# Patient Record
Sex: Female | Born: 1970 | Race: Black or African American | Hispanic: No | Smoking: Never smoker
Health system: Southern US, Community
[De-identification: ages and names within clinical notes are randomized; demographics above are authoritative.]

## PROBLEM LIST (undated history)

## (undated) DIAGNOSIS — J45909 Unspecified asthma, uncomplicated: Secondary | ICD-10-CM

## (undated) HISTORY — PX: APPENDECTOMY: SHX54

## (undated) HISTORY — PX: TONSILLECTOMY: SUR1361

---

## 2010-09-17 ENCOUNTER — Emergency Department (HOSPITAL_COMMUNITY): Admission: EM | Admit: 2010-09-17 | Discharge: 2010-09-17 | Payer: Self-pay | Admitting: Emergency Medicine

## 2010-09-30 ENCOUNTER — Emergency Department (HOSPITAL_COMMUNITY): Admission: EM | Admit: 2010-09-30 | Discharge: 2010-09-30 | Payer: Self-pay | Admitting: Emergency Medicine

## 2010-10-19 ENCOUNTER — Inpatient Hospital Stay (HOSPITAL_COMMUNITY): Admission: AD | Admit: 2010-10-19 | Discharge: 2010-10-19 | Payer: Self-pay | Admitting: Obstetrics & Gynecology

## 2011-02-08 LAB — GC/CHLAMYDIA PROBE AMP, GENITAL: Chlamydia, DNA Probe: NEGATIVE

## 2011-02-08 LAB — URINE CULTURE: Colony Count: 10000

## 2011-02-08 LAB — URINALYSIS, ROUTINE W REFLEX MICROSCOPIC
Bilirubin Urine: NEGATIVE
Bilirubin Urine: NEGATIVE
Hgb urine dipstick: NEGATIVE
Nitrite: NEGATIVE
Nitrite: NEGATIVE
Protein, ur: 30 mg/dL — AB
Protein, ur: NEGATIVE mg/dL
Urobilinogen, UA: 1 mg/dL (ref 0.0–1.0)
Urobilinogen, UA: 1 mg/dL (ref 0.0–1.0)
pH: 6 (ref 5.0–8.0)

## 2011-02-08 LAB — URINE MICROSCOPIC-ADD ON

## 2011-02-08 LAB — POCT PREGNANCY, URINE: Preg Test, Ur: NEGATIVE

## 2011-02-08 LAB — WET PREP, GENITAL: Clue Cells Wet Prep HPF POC: NONE SEEN

## 2011-02-09 LAB — URINALYSIS, ROUTINE W REFLEX MICROSCOPIC
Glucose, UA: NEGATIVE mg/dL
pH: 6 (ref 5.0–8.0)

## 2011-02-09 LAB — URINE MICROSCOPIC-ADD ON

## 2012-06-11 ENCOUNTER — Emergency Department (HOSPITAL_COMMUNITY): Payer: Self-pay

## 2012-06-11 ENCOUNTER — Emergency Department (HOSPITAL_COMMUNITY)
Admission: EM | Admit: 2012-06-11 | Discharge: 2012-06-11 | Disposition: A | Discharge status: Home / Self Care | Payer: Self-pay | Attending: Emergency Medicine | Admitting: Emergency Medicine

## 2012-06-11 ENCOUNTER — Encounter (HOSPITAL_COMMUNITY): Payer: Self-pay | Admitting: Emergency Medicine

## 2012-06-11 DIAGNOSIS — J45909 Unspecified asthma, uncomplicated: Secondary | ICD-10-CM | POA: Insufficient documentation

## 2012-06-11 DIAGNOSIS — X500XXA Overexertion from strenuous movement or load, initial encounter: Secondary | ICD-10-CM | POA: Insufficient documentation

## 2012-06-11 DIAGNOSIS — M79609 Pain in unspecified limb: Secondary | ICD-10-CM | POA: Insufficient documentation

## 2012-06-11 DIAGNOSIS — S93609A Unspecified sprain of unspecified foot, initial encounter: Secondary | ICD-10-CM | POA: Insufficient documentation

## 2012-06-11 DIAGNOSIS — IMO0002 Reserved for concepts with insufficient information to code with codable children: Secondary | ICD-10-CM | POA: Insufficient documentation

## 2012-06-11 HISTORY — DX: Unspecified asthma, uncomplicated: J45.909

## 2012-06-11 MED ORDER — HYDROCODONE-ACETAMINOPHEN 5-325 MG PO TABS
2.0000 | ORAL_TABLET | Freq: Once | ORAL | Status: DC
  Filled 2012-06-11: qty 2

## 2012-06-11 MED ORDER — IBUPROFEN 600 MG PO TABS: 600.0000 mg | ORAL_TABLET | Freq: Three times a day (TID) | ORAL | Status: AC | PRN

## 2012-06-11 MED ORDER — IBUPROFEN 400 MG PO TABS
600.0000 mg | ORAL_TABLET | Freq: Once | ORAL | Status: AC
  Administered 2012-06-11: 600 mg via ORAL
  Filled 2012-06-11: qty 1

## 2012-06-11 MED ORDER — HYDROCODONE-ACETAMINOPHEN 5-500 MG PO TABS: 1.0000 | ORAL_TABLET | Freq: Four times a day (QID) | ORAL | Status: AC | PRN

## 2012-06-11 NOTE — ED Provider Notes (Addendum)
History    This chart was scribed for Suzi Roots, MD, MD by Smitty Pluck. The patient was seen in room TR09C and the patient's care was started at 3:35PM.   CSN: [id number]  Arrival date & time 06/11/12  1520   None     Chief Complaint  Patient presents with  . Foot Pain    (Consider location/radiation/quality/duration/timing/severity/associated sxs/prior treatment) Patient is a 41 y.o. female presenting with lower extremity pain. The history is provided by the patient.  Foot Pain Pertinent negatives include no shortness of breath.   Patty Blankenship is a 41 y.o. female who presents to the Emergency Department complaining of constant moderate right foot pain onset today 1 hour ago. Pt reports rolling her right foot as she stepped onto gravel path outside of son's school. Pt reports that she is not able to walk due to pain. Reports that her last tetanus was 5 years ago. Denies knee pain. Denies radiation. Pt reports abrasion to right foot. Denies any other pain.   Past Medical History  Diagnosis Date  . Asthma     History reviewed. No pertinent past surgical history.  History reviewed. No pertinent family history.  History  Substance Use Topics  . Smoking status: Never Smoker   . Smokeless tobacco: Not on file  . Alcohol Use: No    OB History    Grav Para Term Preterm Abortions TAB SAB Ect Mult Living                  Review of Systems  Constitutional: Negative for fever and chills.  Respiratory: Negative for shortness of breath.   Gastrointestinal: Negative for nausea and vomiting.  Neurological: Negative for weakness.    Allergies  Crab and Sulfa antibiotics  Home Medications   Current Outpatient Rx  Name Route Sig Dispense Refill  . ACETAMINOPHEN 500 MG PO TABS Oral Take 1,000 mg by mouth every 6 (six) hours as needed. For pain    . ALBUTEROL SULFATE HFA 108 (90 BASE) MCG/ACT IN AERS Inhalation Inhale 2 puffs into the lungs every 6 (six) hours as needed.  For shortness of breath      BP 132/80  Pulse 111  Temp 98.2 F (36.8 C) (Oral)  Resp 16  SpO2 98%  Physical Exam  Nursing note and vitals reviewed. Constitutional: She is oriented to person, place, and time. She appears well-developed and well-nourished. No distress.  HENT:  Head: Normocephalic and atraumatic.  Eyes: EOM are normal.  Neck: Neck supple. No tracheal deviation present.  Cardiovascular: Normal rate.   Pulmonary/Chest: Effort normal. No respiratory distress.  Musculoskeletal: Normal range of motion.       Right ankle non-tender, no malleolar tenderness. Ankle stable. Dp/pt 2+.  Right foot with tenderness over proximal 4th and 5th metatarsals, small approximately 1 cm superficial abrasion to lateral aspect right foot. V superficial, no bone/tendon exposed.   Neurological: She is alert and oriented to person, place, and time.  Skin: Skin is warm and dry.  Psychiatric: She has a normal mood and affect. Her behavior is normal.    ED Course  Procedures (including critical care time) DIAGNOSTIC STUDIES: Oxygen Saturation is 98% on room air, normal by my interpretation.    COORDINATION OF CARE: 3:40PM EDP ordered medication: 325 mg Norco   Dg Foot Complete Right  06/11/2012  *RADIOLOGY REPORT*  Clinical Data: Twisting injury to the right foot, lateral pain  RIGHT FOOT COMPLETE - 3+ VIEW  Comparison: None.  Findings: On the PA projection, there is an osseous fragment adjacent to the lateral aspect of the calcaneus which could represent avulsion fracture.  No radiopaque foreign body.  No other fracture type deformity or dislocation is noted.  IMPRESSION: A possible tiny avulsion fracture fragment from the calcaneus.  Original Report Authenticated By: Harrel Lemon, M.D.      MDM  I personally performed the services described in this documentation, which was scribed in my presence. The recorded information has been reviewed and considered. Suzi Roots, MD   Pt  took tylenol pta. Motrin po.   Xrays.  Hr 88 rr 16. Awaiting xrays.   No tenderness/pain to heel/calcaneus area.   Foot sprain/contusion.   Discussed w pt small avulsion/chip fx off calc possibly c/w bad sprain   Suzi Roots, MD 06/11/12 1732  Suzi Roots, MD 06/11/12 [phone]

## 2012-06-11 NOTE — ED Notes (Signed)
Pt reports walking on gravel and falling and twisting her ankle.  Pt noted to have swelling and tenderness on her (R) ankle.  Took 1000mg  tylenol PTA

## 2012-06-11 NOTE — Progress Notes (Signed)
Orthopedic Tech Progress Note Patient Details:  Patty Blankenship [birth year] [id number]  Ortho Devices Type of Ortho Device: Crutches Ortho Device/Splint Interventions: Application   Nikki Dom 06/11/2012, 6:11 PM

## 2012-06-11 NOTE — ED Notes (Signed)
Pt sts rolled right foot today; pt has small abrasion and pain on foot; mild swelling noted

## 2014-06-03 ENCOUNTER — Other Ambulatory Visit: Payer: Self-pay | Admitting: Physician Assistant

## 2014-06-03 DIAGNOSIS — Z1231 Encounter for screening mammogram for malignant neoplasm of breast: Secondary | ICD-10-CM

## 2014-06-13 ENCOUNTER — Encounter (INDEPENDENT_AMBULATORY_CARE_PROVIDER_SITE_OTHER): Payer: Self-pay

## 2014-06-13 ENCOUNTER — Ambulatory Visit
Admission: RE | Admit: 2014-06-13 | Discharge: 2014-06-13 | Disposition: A | Discharge status: Home / Self Care | Payer: Medicaid Other | Source: Ambulatory Visit | Attending: Physician Assistant | Admitting: Physician Assistant

## 2014-06-13 DIAGNOSIS — Z1231 Encounter for screening mammogram for malignant neoplasm of breast: Secondary | ICD-10-CM

## 2014-06-17 ENCOUNTER — Other Ambulatory Visit: Payer: Self-pay | Admitting: Physician Assistant

## 2014-06-17 DIAGNOSIS — R928 Other abnormal and inconclusive findings on diagnostic imaging of breast: Secondary | ICD-10-CM

## 2014-06-20 ENCOUNTER — Other Ambulatory Visit: Payer: Medicaid Other

## 2014-06-27 ENCOUNTER — Ambulatory Visit
Admission: RE | Admit: 2014-06-27 | Discharge: 2014-06-27 | Disposition: A | Discharge status: Home / Self Care | Payer: Medicaid Other | Source: Ambulatory Visit | Attending: Physician Assistant | Admitting: Physician Assistant

## 2014-06-27 DIAGNOSIS — R928 Other abnormal and inconclusive findings on diagnostic imaging of breast: Secondary | ICD-10-CM

## 2014-12-03 ENCOUNTER — Other Ambulatory Visit: Payer: Self-pay | Admitting: Physician Assistant

## 2014-12-03 DIAGNOSIS — N63 Unspecified lump in unspecified breast: Secondary | ICD-10-CM

## 2014-12-26 ENCOUNTER — Ambulatory Visit
Admission: RE | Admit: 2014-12-26 | Discharge: 2014-12-26 | Disposition: A | Discharge status: Home / Self Care | Payer: Medicaid Other | Source: Ambulatory Visit | Attending: Physician Assistant | Admitting: Physician Assistant

## 2014-12-26 DIAGNOSIS — N63 Unspecified lump in unspecified breast: Secondary | ICD-10-CM

## 2015-05-25 ENCOUNTER — Other Ambulatory Visit: Payer: Self-pay | Admitting: Physician Assistant

## 2015-05-25 DIAGNOSIS — N6310 Unspecified lump in the right breast, unspecified quadrant: Secondary | ICD-10-CM

## 2015-06-18 ENCOUNTER — Ambulatory Visit
Admission: RE | Admit: 2015-06-18 | Discharge: 2015-06-18 | Disposition: A | Discharge status: Home / Self Care | Payer: Medicaid Other | Source: Ambulatory Visit | Attending: Physician Assistant | Admitting: Physician Assistant

## 2015-06-18 ENCOUNTER — Ambulatory Visit: Payer: Medicaid Other

## 2015-06-18 DIAGNOSIS — N6310 Unspecified lump in the right breast, unspecified quadrant: Secondary | ICD-10-CM

## 2015-12-15 ENCOUNTER — Emergency Department (HOSPITAL_BASED_OUTPATIENT_CLINIC_OR_DEPARTMENT_OTHER)
Admit: 2015-12-15 | Discharge: 2015-12-15 | Disposition: A | Discharge status: Home / Self Care | Payer: Medicaid Other | Attending: Emergency Medicine | Admitting: Emergency Medicine

## 2015-12-15 ENCOUNTER — Emergency Department (HOSPITAL_COMMUNITY)
Admission: EM | Admit: 2015-12-15 | Discharge: 2015-12-15 | Disposition: A | Discharge status: Home / Self Care | Payer: Medicaid Other | Attending: Emergency Medicine | Admitting: Emergency Medicine

## 2015-12-15 ENCOUNTER — Encounter (HOSPITAL_COMMUNITY): Payer: Self-pay | Admitting: Emergency Medicine

## 2015-12-15 DIAGNOSIS — M79662 Pain in left lower leg: Secondary | ICD-10-CM | POA: Insufficient documentation

## 2015-12-15 DIAGNOSIS — Z79899 Other long term (current) drug therapy: Secondary | ICD-10-CM | POA: Insufficient documentation

## 2015-12-15 DIAGNOSIS — M79609 Pain in unspecified limb: Secondary | ICD-10-CM

## 2015-12-15 DIAGNOSIS — J45909 Unspecified asthma, uncomplicated: Secondary | ICD-10-CM | POA: Diagnosis not present

## 2015-12-15 LAB — CBC WITH DIFFERENTIAL/PLATELET
BASOS PCT: 0 %
Basophils Absolute: 0 10*3/uL (ref 0.0–0.1)
Eosinophils Absolute: 0 10*3/uL (ref 0.0–0.7)
Eosinophils Relative: 0 %
HEMATOCRIT: 42.8 % (ref 36.0–46.0)
Hemoglobin: 14 g/dL (ref 12.0–15.0)
LYMPHS PCT: 25 %
Lymphs Abs: 2.9 10*3/uL (ref 0.7–4.0)
MCH: 30.1 pg (ref 26.0–34.0)
MCHC: 32.7 g/dL (ref 30.0–36.0)
MCV: 92 fL (ref 78.0–100.0)
MONO ABS: 0.7 10*3/uL (ref 0.1–1.0)
Monocytes Relative: 6 %
NEUTROS ABS: 8.1 10*3/uL — AB (ref 1.7–7.7)
Neutrophils Relative %: 69 %
Platelets: 288 10*3/uL (ref 150–400)
RBC: 4.65 MIL/uL (ref 3.87–5.11)
RDW: 13.8 % (ref 11.5–15.5)
WBC: 11.8 10*3/uL — ABNORMAL HIGH (ref 4.0–10.5)

## 2015-12-15 LAB — BASIC METABOLIC PANEL
Anion gap: 13 (ref 5–15)
BUN: 12 mg/dL (ref 6–20)
CO2: 23 mmol/L (ref 22–32)
CREATININE: 0.83 mg/dL (ref 0.44–1.00)
Calcium: 9.4 mg/dL (ref 8.9–10.3)
Chloride: 103 mmol/L (ref 101–111)
GFR calc Af Amer: >60 mL/min (ref >60)
GFR calc non Af Amer: >60 mL/min (ref >60)
GLUCOSE: 102 mg/dL — AB (ref 65–99)
Potassium: 4 mmol/L (ref 3.5–5.1)
Sodium: 139 mmol/L (ref 135–145)

## 2015-12-15 NOTE — Discharge Instructions (Signed)

## 2015-12-15 NOTE — ED Provider Notes (Signed)
CSN: [id number]     Arrival date & time 12/15/15  1514 History   First MD Initiated Contact with Patient 12/15/15 1839     Chief Complaint  Patient presents with  . Leg Swelling     (Consider location/radiation/quality/duration/timing/severity/associated sxs/prior Treatment) HPI Comments: Two day history of increasing left calf pain that has started to radiate to left upper leg. Non-smoker, not on hormone therapy.  No chest pain or shortness of breath.  She contacted her PCP, who recommended she come to the ED to r/o DVT.  Patient is a 45 y.o. female presenting with leg pain. The history is provided by the patient. No language interpreter was used.  Leg Pain Location:  Leg Time since incident:  2 days Injury: no   Leg location:  L lower leg Pain details:    Quality:  Throbbing   Radiates to:  L leg   Severity:  Mild   Onset quality:  Sudden   Duration:  2 days   Timing:  Constant   Progression:  Worsening Chronicity:  New Prior injury to area:  No   Past Medical History  Diagnosis Date  . Asthma    History reviewed. No pertinent past surgical history. History reviewed. No pertinent family history. Social History  Substance Use Topics  . Smoking status: Never Smoker   . Smokeless tobacco: None  . Alcohol Use: No   OB History    No data available     Review of Systems  Respiratory: Negative for shortness of breath.   Cardiovascular: Negative for chest pain.  Musculoskeletal: Positive for myalgias.  All other systems reviewed and are negative.     Allergies  Crab and Sulfa antibiotics  Home Medications   Prior to Admission medications   Medication Sig Start Date End Date Taking? Authorizing Provider  acetaminophen (TYLENOL) 500 MG tablet Take 1,000 mg by mouth every 6 (six) hours as needed. For pain    Historical Provider, MD  albuterol (PROVENTIL HFA;VENTOLIN HFA) 108 (90 BASE) MCG/ACT inhaler Inhale 2 puffs into the lungs every 6 (six) hours as needed.  For shortness of breath    Historical Provider, MD   BP 144/92 mmHg  Pulse 99  Temp(Src) 98.6 F (37 C) (Oral)  Resp 16  SpO2 99% Physical Exam  Constitutional: She is oriented to person, place, and time. She appears well-developed and well-nourished.  HENT:  Head: Normocephalic and atraumatic.  Eyes: Conjunctivae are normal.  Neck: Neck supple.  Cardiovascular: Normal rate and regular rhythm.   Pulmonary/Chest: Effort normal and breath sounds normal.  Abdominal: Soft. Bowel sounds are normal.  Musculoskeletal: She exhibits tenderness. She exhibits no edema.       Legs: Lymphadenopathy:    She has no cervical adenopathy.  Neurological: She is alert and oriented to person, place, and time.  Skin: Skin is warm and dry.  Psychiatric: She has a normal mood and affect.  Nursing note and vitals reviewed.   ED Course  Procedures (including critical care time) Labs Review Labs Reviewed  CBC WITH DIFFERENTIAL/PLATELET - Abnormal; Notable for the following:    WBC 11.8 (*)    Neutro Abs 8.1 (*)    All other components within normal limits  BASIC METABOLIC PANEL - Abnormal; Notable for the following:    Glucose, Bld 102 (*)    All other components within normal limits    Imaging Review No results found. I have personally reviewed and evaluated these images and lab results as part  of my medical decision-making.   EKG Interpretation None     Vascular ultrasound completed.  No indication of DVT per sonographer note. MDM   Final diagnoses:  None    Left calf pain. Care instructions provided. Return precautions discussed. Follow-up with PCP.    Patty Morn, NP 12/16/15 0132  Melene Plan, DO 12/16/15 1610

## 2015-12-15 NOTE — Progress Notes (Signed)
*  PRELIMINARY RESULTS* Vascular Ultrasound Left lower extremity venous duplex has been completed.  Preliminary findings: No evidence of DVT or baker's cyst.   Farrel Demark, RDMS, RVT  12/15/2015, 7:39 PM

## 2015-12-15 NOTE — ED Notes (Signed)
Pt reports L calf swelling for the past 2 days. Called PCP who advised her to come here to rule out a blood clot. Denies CP or SOB. No recent long periods of immobility.

## 2015-12-15 NOTE — ED Notes (Signed)
Bed: ZO10 Expected date:  Expected time:  Means of arrival:  Comments: Barry Dienes

## 2015-12-15 NOTE — Progress Notes (Signed)
Updated EPIC

## 2016-03-15 ENCOUNTER — Emergency Department (HOSPITAL_COMMUNITY)
Admission: EM | Admit: 2016-03-15 | Discharge: 2016-03-15 | Disposition: A | Discharge status: Home / Self Care | Payer: Medicaid Other | Attending: Emergency Medicine | Admitting: Emergency Medicine

## 2016-03-15 ENCOUNTER — Emergency Department (HOSPITAL_COMMUNITY): Payer: Medicaid Other

## 2016-03-15 ENCOUNTER — Encounter (HOSPITAL_COMMUNITY): Payer: Self-pay | Admitting: Emergency Medicine

## 2016-03-15 DIAGNOSIS — J45909 Unspecified asthma, uncomplicated: Secondary | ICD-10-CM | POA: Diagnosis not present

## 2016-03-15 DIAGNOSIS — R600 Localized edema: Secondary | ICD-10-CM | POA: Diagnosis not present

## 2016-03-15 DIAGNOSIS — Z79899 Other long term (current) drug therapy: Secondary | ICD-10-CM | POA: Insufficient documentation

## 2016-03-15 DIAGNOSIS — Z7982 Long term (current) use of aspirin: Secondary | ICD-10-CM | POA: Diagnosis not present

## 2016-03-15 DIAGNOSIS — M25562 Pain in left knee: Secondary | ICD-10-CM | POA: Insufficient documentation

## 2016-03-15 MED ORDER — HYDROCODONE-ACETAMINOPHEN 5-325 MG PO TABS: 1.0000 | ORAL_TABLET | Freq: Four times a day (QID) | ORAL | Status: AC | PRN

## 2016-03-15 MED ORDER — HYDROCODONE-ACETAMINOPHEN 5-325 MG PO TABS
1.0000 | ORAL_TABLET | Freq: Once | ORAL | Status: AC
  Administered 2016-03-15: 1 via ORAL
  Filled 2016-03-15: qty 1

## 2016-03-15 NOTE — ED Provider Notes (Signed)
CSN: [id number]     Arrival date & time 03/15/16  0805 History   First MD Initiated Contact with Patient 03/15/16 [phone]     Chief Complaint  Patient presents with  . Knee Pain     (Consider location/radiation/quality/duration/timing/severity/associated sxs/prior Treatment) Patient is a 45 y.o. female presenting with knee pain.  Knee Pain Location:  Knee and ankle Knee location:  L knee Ankle location:  L ankle Pain details:    Quality:  Sharp   Severity:  Mild   Onset quality:  Gradual   Timing:  Constant   Progression:  Unchanged Chronicity:  New Dislocation: no   Associated symptoms: no fatigue and no fever     Past Medical History  Diagnosis Date  . Asthma    History reviewed. No pertinent past surgical history. History reviewed. No pertinent family history. Social History  Substance Use Topics  . Smoking status: Never Smoker   . Smokeless tobacco: None  . Alcohol Use: No   OB History    No data available     Review of Systems  Constitutional: Negative for fever and fatigue.  Eyes: Negative for pain.  Respiratory: Negative for cough and shortness of breath.   Cardiovascular: Negative for chest pain.  Endocrine: Negative for polydipsia and polyuria.  Musculoskeletal: Positive for joint swelling (and pain).  All other systems reviewed and are negative.     Allergies  Crab and Sulfa antibiotics  Home Medications   Prior to Admission medications   Medication Sig Start Date End Date Taking? Authorizing Provider  acetaminophen (TYLENOL) 500 MG tablet Take 1,000 mg by mouth every 6 (six) hours as needed. For pain    Historical Provider, MD  aspirin 325 MG tablet Take 650 mg by mouth once.    Historical Provider, MD  cholecalciferol (VITAMIN D) 1000 units tablet Take 2,000 Units by mouth daily.    Historical Provider, MD  HYDROcodone-acetaminophen (NORCO/VICODIN) 5-325 MG tablet Take 1 tablet by mouth every 6 (six) hours as needed for moderate pain. 03/15/16    Marily MemosJason Timon Geissinger, MD   BP 164/81 mmHg  Pulse 100  Temp(Src) 98.1 F (36.7 C) (Oral)  Resp 16  SpO2 100%  LMP 03/08/2016 Physical Exam  Constitutional: She is oriented to person, place, and time. She appears well-developed and well-nourished.  HENT:  Head: Normocephalic and atraumatic.  Neck: Normal range of motion.  Cardiovascular: Normal rate and regular rhythm.   Pulmonary/Chest: Effort normal. No stridor. No respiratory distress.  Abdominal: She exhibits no distension.  Musculoskeletal: She exhibits edema (around left knee).       Left knee: She exhibits decreased range of motion (2/2 discomfort) and swelling. Tenderness found.  Neurological: She is alert and oriented to person, place, and time.  Nursing note and vitals reviewed.   ED Course  Procedures (including critical care time) Labs Review Labs Reviewed - No data to display  Imaging Review Ct Knee Left Wo Contrast  03/15/2016  CLINICAL DATA:  Larey SeatFell 3 weeks ago. Persistent knee pain and lower extremity pain. EXAM: CT TIBIA FIBULA LEFT WITHOUT CONTRAST; CT OF THE LEFT KNEE WITHOUT CONTRAST TECHNIQUE: Multidetector CT imaging was performed according to the standard protocol. Multiplanar CT image reconstructions were also generated. COMPARISON:  None. FINDINGS: The knee joint spaces are heme team. No significant degenerative changes or osteochondral abnormality. No acute fracture. No joint effusion. The tibia and fibula are intact. No fractures identified. The ankle joint is grossly normal. No significant soft tissue abnormality is identified.  Grossly the cruciate and collateral ligaments are intact. IMPRESSION: No acute bony findings involving the knee or tibia/ fibula. Electronically Signed   By: Rudie Meyer M.D.   On: 03/15/2016 09:30   Ct Tibia Fibula Left Wo Contrast  03/15/2016  CLINICAL DATA:  Larey Seat 3 weeks ago. Persistent knee pain and lower extremity pain. EXAM: CT TIBIA FIBULA LEFT WITHOUT CONTRAST; CT OF THE LEFT KNEE  WITHOUT CONTRAST TECHNIQUE: Multidetector CT imaging was performed according to the standard protocol. Multiplanar CT image reconstructions were also generated. COMPARISON:  None. FINDINGS: The knee joint spaces are heme team. No significant degenerative changes or osteochondral abnormality. No acute fracture. No joint effusion. The tibia and fibula are intact. No fractures identified. The ankle joint is grossly normal. No significant soft tissue abnormality is identified. Grossly the cruciate and collateral ligaments are intact. IMPRESSION: No acute bony findings involving the knee or tibia/ fibula. Electronically Signed   By: Rudie Meyer M.D.   On: 03/15/2016 09:30   I have personally reviewed and evaluated these images and lab results as part of my medical decision-making.   EKG Interpretation None      MDM   Final diagnoses:  Left knee pain    CT negative for any acute pathology. Crutches and ace wrap provided. Patient likely needs MRI however not in an emergent basis. Will continuing following up with pcp for ortho follow up.   New Prescriptions: New Prescriptions   HYDROCODONE-ACETAMINOPHEN (NORCO/VICODIN) 5-325 MG TABLET    Take 1 tablet by mouth every 6 (six) hours as needed for moderate pain.    I have personally and contemperaneously reviewed labs and imaging and used in my decision making as above.   A medical screening exam was performed and I feel the patient has had an appropriate workup for their chief complaint at this time and likelihood of emergent condition existing is low. Their vital signs are stable. They have been counseled on decision, discharge, follow up and which symptoms necessitate immediate return to the emergency department.  They verbally stated understanding and agreement with plan and discharged in stable condition.      Marily Memos, MD 03/15/16 [phone]

## 2016-03-15 NOTE — ED Notes (Signed)
Per pt, states fell 3 weeks ago-left knee pain-trying to get into seeing Ortho

## 2016-04-18 ENCOUNTER — Ambulatory Visit: Payer: Medicaid Other | Admitting: Physical Therapy

## 2016-04-27 ENCOUNTER — Encounter: Payer: Self-pay | Admitting: Physical Therapy

## 2016-04-27 ENCOUNTER — Ambulatory Visit: Payer: Medicaid Other | Attending: Orthopedic Surgery | Admitting: Physical Therapy

## 2016-04-27 DIAGNOSIS — M25562 Pain in left knee: Secondary | ICD-10-CM | POA: Diagnosis present

## 2016-04-27 DIAGNOSIS — M6281 Muscle weakness (generalized): Secondary | ICD-10-CM | POA: Diagnosis not present

## 2016-04-27 NOTE — Patient Instructions (Signed)
Ride exercise bike for 10 min trying to go all the way around but if unable to then go back and forth. Low resistance.   Massage patella tendon for 5 min  Patellar Tendon Cross Friction on you tube.    Contrast Bath  Prepare the baths.  Cold = 55-65 degrees     Hot = 105-110 degrees  Starting with the hotpack on left knee hold there for selected duration.  Preferably 3 minutes.  After selected duration is up, then place ice pack on left knee  (3 minutes hot, 1 minute cold)  Alternate back and forth for the times indicated for no more than a total of 20 minutes ending with hot.  Reduces the swelling in left knee.   Massage knee daily for 5 min to reduce pain.    Stand at counter and weight shift from side to side-5 30 x, then place one foot ahead and shift forward and backward 5-30 times  Then switch HIP / KNEE: Flexion, Heel Slides - Supine    Slide heel up toward buttocks, keeping leg in straight line. _10__ reps per set, __1_ sets per day, _2__ days per week Use towel or pillowcase under heel as needed.  Copyright  VHI. All rights reserved.     Quad Sets    Slowly tighten thigh muscles of straight, left leg while counting out loud to __5__. Relax. Repeat __10__ times. Do _2___ sessions per day.  [email or web address]   Copyright  VHI. All rights reserved.    Self-Mobilization: Outward Kneecap Pull    With entire length of thumb along inner border of left kneecap, gently pull kneecap out. Hold ____ seconds. Repeat ____ times per set. Do ____ sets per session. Do ____ sessions per day.  http://[email or web address].us/588   Copyright  VHI. All rights reserved.  Self-Mobilization: Outward Kneecap Pull    With entire length of thumb along inner border of left kneecap, gently pull kneecap out. Hold _2___ seconds. Repeat _5___ times per set. Do _1___ sets per session. Do _2___ sessions per day.  http://[email or web address].[email or web address]/588   Copyright  VHI. All rights reserved.    Self-Mobilization: Outward Kneecap Pull    With entire length of thumb along inner border of left kneecap, gently pull kneecap out. Hold __2__ seconds. Repeat __5__ times per set. Do __1__ sets per session. Do __2__ sessions per day.  http://[email or web address].us/588   Copyright  VHI. All rights reserved.   Stretching: Calf - Towel    Sit with knee straight and towel looped around left foot. Gently pull on towel until stretch is felt in calf. Hold __30__ seconds. Repeat __2__ times per set. Do __1__ sets per session. Do _2___ sessions per day.  http://orth.[email or web address].us/706   Copyright  VHI. All rights reserved.    Stretching: Hamstring (Sitting)    With right leg straight, tuck other foot near groin. Reach down until stretch is felt in back of thigh. Keep back straight. Hold __30__ seconds. Repeat __2__ times per set. Do __1__ sets per session. Do __2__ sessions per day.  http://orth.[email or web address].us/660   Copyright  VHI. All rights reserved.    Strengthening: Straight Leg Raise (Phase 1)    Tighten muscles on front of right thigh, then lift leg _6___ inches from surface, keeping knee locked.  Repeat _10___ times per set. Do __2__ sets per session. Do __2__ sessions per day.  http://orth.[email or web address].us/614   Copyright  VHI. All rights reserved.   Strengthening: Terminal Knee Extension (Supine)    With right knee over  bolster, straighten knee by tightening muscles on top of thigh. Keep bottom of knee on bolster. Repeat _10___ times per set. Do _2___ sets per session. Do _2___ sessions per day.  http://orth.[email or web address].us/626   Copyright  VHI. All rights reserved.   Knee Extension: Terminal - Standing (Single Leg)    Face anchor in shoulder width stance, band around knee. Allow tension of band to slightly bend knee. Pull leg back, straightening knee. Repeat _10_ times per set. Repeat with other leg. Do _1_ sets per session. Do _2_ sessions per week. Anchor Height: Knee Add yellow  rubber band when you can do without pain.  http://tub.exer.us/36   Copyright  VHI. All rights reserved.   Self-Mobilization: Downward Kneecap Push    With thumbs on upper border of left kneecap, gently push kneecap toward foot. Hold __2__ seconds. Repeat ___5_ times per set. Do __1__ sets per session. Do __1__ sessions per day.  http://orth.exer.us/582   Copyright  VHI. All rights reserved.    http://orth.exer.us/732   Copyright  VHI. All rights reserved.  Knee Extension (Sitting)    Place ___0_ pound weight on left ankle and straighten knee fully, lower slowly. Repeat __10__ times per set. Do _2___ sets per session. Do _2___ sessions per day.  http://orth.exer.us/732   Copyright  VHI. All rights reserved.    Step-Down / Step-Up    Stand on stair step or __4__ inch stool. Slowly bend left leg, lowering other foot to floor. Return by straightening front leg. Repeat __10__ times per set. Do __1__ sets per session. Do __1__ sessions per day. Only do with full weight on left knee http://orth.exer.us/684   Copyright  VHI. All rights reserved.  Hamstring Curl: Resisted (Sitting)    Facing anchor with tubing on right ankle, leg straight out, bend knee. Repeat __10__ times per set. Do _2___ sets per session. Do _1___ sessions per day.  Start with yellow, when easy go red http://orth.exer.us/668   Copyright  VHI. All rights reserved.   Knee Extension: Resisted (Sitting)    With band looped around right ankle and under other foot, straighten leg with ankle loop. Keep other leg bent to increase resistance. Repeat _10___ times per set. Do _2___ sets per session. Do __1__ sessions per day. Start with yellow then if easy use red.  http://orth.exer.us/690   Copyright  VHI. All rights reserved.    [facility] [address], [address] [place], [place] [address] Phone # [phone] Fax [phone] [email or web address]

## 2016-04-27 NOTE — Therapy (Signed)
[facility] [address], [address] [place], [place], [address] Phone: [phone]   Fax:  [phone]  Physical Therapy Evaluation  Patient Details  Name: Patty NorfolkKimberly Gillihan MRN: [id number] Date of Birth: 1971-03-05 Referring Provider: Dr. Salvatore Marvelobert Wainer  Encounter Date: 04/27/2016      PT End of Session - 04/27/16 1253    Visit Number 1   Authorization Type Medicaid   PT Start Time 1145   PT Stop Time 1250   PT Time Calculation (min) 65 min   Activity Tolerance Patient tolerated treatment well   Behavior During Therapy [facility] for tasks assessed/performed      Past Medical History  Diagnosis Date  . Asthma     History reviewed. No pertinent past surgical history.  There were no vitals filed for this visit.       Subjective Assessment - 04/27/16 1151    Subjective I fell down the stairs. She had several stitches.  I fell on 02/21/2016. I saw 2 doctors. I have been on crutches 4 weeks. No weightbear restrictions.  Heel go caught on pant leg and fell down the steps onto cement.    Patient Stated Goals understand exercises for her knee   Currently in Pain? Yes   Pain Score 6    Pain Location Knee   Pain Orientation Left   Pain Descriptors / Indicators Dull;Burning   Pain Type Chronic pain   Pain Onset More than a month ago   Pain Frequency Intermittent   Aggravating Factors  go up and down steps, siit with knee hanging on chair, walking too fast   Pain Relieving Factors ice, unweight leg, walk slower; step to step pattern on steps   Multiple Pain Sites No            [facility] PT Assessment - 04/27/16 0001    Assessment   Medical Diagnosis chondromalacia patella left knee   Referring Provider Dr. Salvatore Marvelobert Wainer   Onset Date/Surgical Date 02/21/16   Prior Therapy No   Precautions   Precautions None   Restrictions   Weight Bearing Restrictions No   Balance Screen   Has the patient fallen in the past 6 months Yes   How many times? 1  heel got caught on pant leg going down stair   Has the patient had a decrease in activity level because of a fear of falling?  No   Is the patient reluctant to leave their home because of a fear of falling?  No   Home [employer]   Living Environment Private residence   Living Arrangements Parent   Type of Home House   Home Access Stairs to enter   Entrance Stairs-Number of Steps 3   Home Layout Two level   Prior Function   Level of Independence Independent   Cognition   Overall Cognitive Status Within Functional Limits for tasks assessed   ROM / Strength   AROM / PROM / Strength AROM;PROM;Strength   AROM   Right/Left Knee Left   Left Knee Extension -10   Left Knee Flexion 120  right is 130   PROM   Right/Left Knee Left   Left Knee Extension 0   Left Knee Flexion 122   Strength   Strength Assessment Site Knee   Right/Left Knee Left   Left Knee Flexion 3/5   Left Knee Extension 3/5   Palpation   Patella mobility decreased mobility of left patella   Palpation comment tenderness located on left  patella tendon, left ITB attachment, tenderness located on left fibula head, lateral quad attachment                           PT Education - 04/27/16 1252    Education provided Yes   Education Details exercise program to progress her left knee strength and weight shifing, patella mobilization, patella transverse friction massage, stretches   Person(s) Educated Patient   Methods Explanation;Demonstration;Verbal cues;Handout   Comprehension Returned demonstration;Verbalized understanding             PT Long Term Goals - 04/27/16 1302    PT LONG TERM GOAL #1   Title Independent with HEP and understand how to progress herself   Time 1   Period Days   Status Achieved               Plan - 04/27/16 1253    Clinical Impression Statement Patient is a 45 year old female who fell down stairs on 02/21/2016 due to her pant leg getting  caught on her heel.  Patient needed several stitches.  Patient has been on crutches for 4 weeks.  Patient reports her left knee intermiitent knee pain is 6/10 with movement, weightbearing activities and stairs.  Patient has to go up and down  stairs with a step to step pattern.  Patient ambulates iwth crutches, decreased weightbearing on left leg, decreased left knee extension. Left knee strength is 3/5. Left knee extension AROM is -10 to 120.  Patient  has decreased mobility of left patella. Patient has palpable tenderness located in left patella tendon, lateral left knee, posterior left fibula head and left lateral quad.  Patient was instructed on HEP to progress her knee strength and decrease pain.  Patient is only able to attend  1 session  due to health insurance.    Rehab Potential Good   Clinical Impairments Affecting Rehab Potential None   PT Frequency 1x / week   PT Duration --  1 session due to Medicaid   PT Treatment/Interventions Patient/family education;Neuromuscular re-education   PT Next Visit Plan Discharge to HEP   PT Home Exercise Plan Current HEP   Recommended Other Services None   Consulted and Agree with Plan of Care Patient      Patient will benefit from skilled therapeutic intervention in order to improve the following deficits and impairments:  Abnormal gait, Pain, Difficulty walking, Decreased strength, Decreased range of motion, Decreased endurance, Decreased activity tolerance, Increased muscle spasms, Decreased mobility  Visit Diagnosis: Muscle weakness (generalized) - Plan: PT plan of care cert/re-cert  Pain in left knee - Plan: PT plan of care cert/re-cert     Problem List There are no active problems to display for this patient.   Eulis Foster, PT 04/27/2016 1:07 PM   [facility] [address], [address] [place], [place], [address] Phone: [phone]   Fax:  [phone]  Name: Rosalin Buster MRN:  [id number] Date of Birth: 08-May-1971

## 2016-06-22 ENCOUNTER — Other Ambulatory Visit: Payer: Self-pay | Admitting: Physician Assistant

## 2016-06-22 DIAGNOSIS — N6310 Unspecified lump in the right breast, unspecified quadrant: Secondary | ICD-10-CM

## 2016-06-29 ENCOUNTER — Ambulatory Visit
Admission: RE | Admit: 2016-06-29 | Discharge: 2016-06-29 | Disposition: A | Discharge status: Home / Self Care | Payer: Medicaid Other | Source: Ambulatory Visit | Attending: Physician Assistant | Admitting: Physician Assistant

## 2016-06-29 DIAGNOSIS — N6310 Unspecified lump in the right breast, unspecified quadrant: Secondary | ICD-10-CM

## 2017-05-18 ENCOUNTER — Other Ambulatory Visit: Payer: Self-pay | Admitting: Physician Assistant

## 2017-05-18 DIAGNOSIS — Z1231 Encounter for screening mammogram for malignant neoplasm of breast: Secondary | ICD-10-CM

## 2017-07-18 ENCOUNTER — Ambulatory Visit: Payer: Medicaid Other

## 2017-11-09 ENCOUNTER — Encounter (HOSPITAL_COMMUNITY): Payer: Self-pay | Admitting: Emergency Medicine

## 2017-11-09 ENCOUNTER — Emergency Department (HOSPITAL_COMMUNITY): Payer: Self-pay

## 2017-11-09 ENCOUNTER — Emergency Department (HOSPITAL_COMMUNITY)
Admission: EM | Admit: 2017-11-09 | Discharge: 2017-11-09 | Disposition: A | Discharge status: Home / Self Care | Payer: Self-pay | Attending: Emergency Medicine | Admitting: Emergency Medicine

## 2017-11-09 DIAGNOSIS — M25332 Other instability, left wrist: Secondary | ICD-10-CM

## 2017-11-09 DIAGNOSIS — Y998 Other external cause status: Secondary | ICD-10-CM | POA: Insufficient documentation

## 2017-11-09 DIAGNOSIS — S63512A Sprain of carpal joint of left wrist, initial encounter: Secondary | ICD-10-CM | POA: Insufficient documentation

## 2017-11-09 DIAGNOSIS — M25522 Pain in left elbow: Secondary | ICD-10-CM | POA: Insufficient documentation

## 2017-11-09 DIAGNOSIS — J45909 Unspecified asthma, uncomplicated: Secondary | ICD-10-CM | POA: Insufficient documentation

## 2017-11-09 DIAGNOSIS — Z79899 Other long term (current) drug therapy: Secondary | ICD-10-CM | POA: Insufficient documentation

## 2017-11-09 DIAGNOSIS — Z7982 Long term (current) use of aspirin: Secondary | ICD-10-CM | POA: Insufficient documentation

## 2017-11-09 DIAGNOSIS — Y929 Unspecified place or not applicable: Secondary | ICD-10-CM | POA: Insufficient documentation

## 2017-11-09 DIAGNOSIS — W010XXA Fall on same level from slipping, tripping and stumbling without subsequent striking against object, initial encounter: Secondary | ICD-10-CM | POA: Insufficient documentation

## 2017-11-09 DIAGNOSIS — Y9301 Activity, walking, marching and hiking: Secondary | ICD-10-CM | POA: Insufficient documentation

## 2017-11-09 MED ORDER — OXYCODONE-ACETAMINOPHEN 5-325 MG PO TABS
1.0000 | ORAL_TABLET | Freq: Once | ORAL | Status: AC
Start: 2017-11-09 — End: 2017-11-09
  Administered 2017-11-09: 1 via ORAL
  Filled 2017-11-09: qty 1

## 2017-11-09 MED ORDER — OXYCODONE-ACETAMINOPHEN 5-325 MG PO TABS: 1.0000 | ORAL_TABLET | Freq: Four times a day (QID) | ORAL | 0 refills | Status: AC | PRN

## 2017-11-09 NOTE — ED Notes (Signed)
Called the radiology dept.  Pt is there w/them and will be brought to triage RM 8 on completion of imaging

## 2017-11-09 NOTE — ED Provider Notes (Signed)
[facility]-EMERGENCY DEPT Provider Note   CSN: [id number] Arrival date & time: 11/09/17  1418     History   Chief Complaint Chief Complaint  Patient presents with  . Fall  . Elbow Pain    HPI Patty Blankenship is a 46 y.o. female with no significant past medical history presents to ED for evaluation of acute onset left wrist and left elbow pain after mechanical fall yesterday. Patient states she slipped on ice and fell on her left upper extremity, she does not remember specifically how she fell. Associated symptoms include grip weakness on the left hand and focal tenderness to the dorsal aspect of the left wrist. Aggravating factors include active range of motion and palpation. Has tried Motrin with minimal relief. She is right-handed. No previous injuries to this extremity or surgeries. She denies head trauma, LOC or prodromal symptoms before fall. No numbness or tingling however states last night her ring finger and pinky finger felt numb but this resolved. No anticoagulants   HPI  Past Medical History:  Diagnosis Date  . Asthma     There are no active problems to display for this patient.   Past Surgical History:  Procedure Laterality Date  . APPENDECTOMY    . CESAREAN SECTION    . TONSILLECTOMY      OB History    No data available       Home Medications    Prior to Admission medications   Medication Sig Start Date End Date Taking? Authorizing Provider  acetaminophen (TYLENOL) 500 MG tablet Take 1,000 mg by mouth every 6 (six) hours as needed. For pain    [provider]  aspirin 325 MG tablet Take 650 mg by mouth once. Reported on 04/27/2016    [provider]  cholecalciferol (VITAMIN D) 1000 units tablet Take 2,000 Units by mouth daily.    [provider]  glucosamine-chondroitin 500-400 MG tablet Take 1 tablet by mouth 3 (three) times daily.    [provider]  HYDROcodone-acetaminophen (NORCO/VICODIN)  5-325 MG tablet Take 1 tablet by mouth every 6 (six) hours as needed for moderate pain. Patient not taking: Reported on 04/27/2016 03/15/16   Mesner, Barbara CowerJason, MD  meloxicam (MOBIC) 7.5 MG tablet Take 7.5 mg by mouth daily.    [provider]  oxyCODONE-acetaminophen (PERCOCET/ROXICET) 5-325 MG tablet Take 1 tablet by mouth every 6 (six) hours as needed for up to 12 days for severe pain. 11/09/17 11/21/17  Liberty HandyGibbons, Claudia J, PA-C    Family History No family history on file.  Social History Social History   Tobacco Use  . Smoking status: Never Smoker  . Smokeless tobacco: Never Used  Substance Use Topics  . Alcohol use: No  . Drug use: No     Allergies   Crab [shellfish allergy] and Sulfa antibiotics   Review of Systems Review of Systems  Musculoskeletal: Positive for arthralgias.  All other systems reviewed and are negative.    Physical Exam Updated Vital Signs BP (!) 141/83 (BP Location: Right Arm)   Pulse 98   Temp 98.3 F (36.8 C) (Oral)   Resp 17   Ht 5\' 5"  (1.651 m)   LMP 10/08/2017   SpO2 98%   Physical Exam  Constitutional: She is oriented to person, place, and time. She appears well-developed and well-nourished. No distress.  NAD.  HENT:  Head: Normocephalic and atraumatic.  Right Ear: External ear normal.  Left Ear: External ear normal.  Nose: Nose normal.  Mouth/Throat: Oropharynx is clear and moist. No oropharyngeal exudate.  Eyes: Conjunctivae and EOM are normal. Pupils are equal, round, and reactive to light. No scleral icterus.  Neck: Normal range of motion.  Cardiovascular: Normal rate, regular rhythm, normal heart sounds and intact distal pulses.  No murmur heard. 2+ radial and ulnar pulse bilaterally  Pulmonary/Chest: Effort normal and breath sounds normal. She has no wheezes. She exhibits no tenderness.  Abdominal: There is tenderness.  Musculoskeletal: Normal range of motion. She exhibits tenderness. She exhibits no deformity.  Focal  tenderness to left dorsal wrist, diffusely, with light touch no obvious deformity or edema. Unable to fully make wrist due to pain. Full opposition and supination of left forearm. Full PROM of left elbow with mild pain. Focal tenderness to left medial epicondyle and medial elbow with mild edema. Painless examination of left shoulder.   Neurological: She is alert and oriented to person, place, and time. No sensory deficit.  Sensation to light touch intact with bilateral upper extremities   Skin: Skin is warm and dry. Capillary refill takes less than 2 seconds.  No skin abrasions or injury to LUE <2 cap refill   Psychiatric: She has a normal mood and affect. Her behavior is normal. Judgment and thought content normal.  Nursing note and vitals reviewed.    ED Treatments / Results  Labs (all labs ordered are listed, but only abnormal results are displayed) Labs Reviewed - No data to display  EKG  EKG Interpretation None       Radiology Dg Elbow Complete Left  Result Date: 11/09/2017 CLINICAL DATA:  Slip and fall with elbow pain, initial encounter EXAM: LEFT ELBOW - COMPLETE 3+ VIEW COMPARISON:  None. FINDINGS: There is no evidence of fracture, dislocation, or joint effusion. There is no evidence of arthropathy or other focal bone abnormality. Soft tissues are unremarkable. IMPRESSION: No acute abnormality noted. Electronically Signed   By: Alcide CleverMark  Lukens M.D.   On: 11/09/2017 16:14   Dg Wrist Complete Left  Result Date: 11/09/2017 CLINICAL DATA:  Fall.  Pain and swelling. EXAM: LEFT WRIST - COMPLETE 3+ VIEW COMPARISON:  No recent prior. FINDINGS: Mild widening of the scapholunate space noted. Scapholunate dissociation cannot be completely excluded. No evidence of fracture dislocation. IMPRESSION: Mild widening of the scapholunate space. Scapholunate dissociation cannot be excluded. No other focal abnormality identified. No evidence of fracture. Electronically Signed   By: Maisie Fushomas  Register    On: 11/09/2017 16:20   Dg Hand Complete Left  Result Date: 11/09/2017 CLINICAL DATA:  Fall. EXAM: LEFT HAND - COMPLETE 3+ VIEW COMPARISON:  No recent. FINDINGS: No acute bony or joint abnormality identified. No evidence of fracture dislocation. IMPRESSION: No acute abnormality identified. Electronically Signed   By: Maisie Fushomas  Register   On: 11/09/2017 16:19    Procedures Procedures (including critical care time)  Medications Ordered in ED Medications  oxyCODONE-acetaminophen (PERCOCET/ROXICET) 5-325 MG per tablet 1 tablet (not administered)     Initial Impression / Assessment and Plan / ED Course  I have reviewed the triage vital signs and the nursing notes.  Pertinent labs & imaging results that were available during my care of the patient were reviewed by me and considered in my medical decision making (see chart for details).  Clinical Course as of Nov 09 1802  Thu Nov 09, 2017  1608 Attempted to see pt; she is in X-ray  [CG]  1631 IMPRESSION: Mild widening of the scapholunate space. Scapholunate dissociation cannot be excluded. No other  focal abnormality identified. No evidence of fracture. DG Wrist Complete Left [CG]  1756 Uable to contact hand x 3. Shared patient with SP who agrees with ED tx and discharge plan  [CG]  1800 Heard from Dr Merlyn Lot, agrees with ED tx.   [CG]    Clinical Course User Index [CG] Liberty Handy, PA-C    46 year old female presents with acute onset left upper extremity pain most significant at left wrist. On exam she has focal tenderness to both dorsal aspect of the wrist but not over the anatomical snuffbox. She has decreased grip strength to this hand secondary to pain, otherwise extremity is NVI. No previous injury to this extremity. Will d/c with wrist splint and f/u with [facility]. Discussed pt with Sp who agrees with ED tx. discussed the nature of injury with patient, stressed importance of follow-up to avoid chronic wrist instability/arthritis/pain.  She verbalized understanding and is agreeable with plan.  Final Clinical Impressions(s) / ED Diagnoses   Final diagnoses:  Scapholunate dissociation of left wrist    ED Discharge Orders        Ordered    oxyCODONE-acetaminophen (PERCOCET/ROXICET) 5-325 MG tablet  Every 6 hours PRN     11/09/17 1803       Liberty Handy, PA-C 11/09/17 1803    Rolland Porter, MD 11/16/17 2355

## 2017-11-09 NOTE — ED Triage Notes (Signed)
Patient reports she fell on ice on Tuesday. C/o left elbow pain that radiates down to left hand.

## 2017-11-09 NOTE — ED Notes (Signed)
Pt has been called in the lobby x 2 w/no response.

## 2017-11-09 NOTE — Discharge Instructions (Signed)
Your wrist x-rays showed a scapholunate dissociation, as we discussed, this means that the ligament holding these 2 wrist bones together may be injured or torn up. Although this is not an emergent case, it can cause chronic wrist instability and predispose due to chronic arthritis and pain. Wear your splint until you're evaluated by hand surgeon who will likely order an MRI for further evaluation. Sometimes this requires surgery. For pain take Tylenol 650 mg PLUS and ibuprofen 600 mg. For breakthrough pain you can take 1 Percocet. Ice.

## 2017-11-22 ENCOUNTER — Other Ambulatory Visit: Payer: Self-pay | Admitting: Orthopedic Surgery

## 2017-11-22 DIAGNOSIS — S62002D Unspecified fracture of navicular [scaphoid] bone of left wrist, subsequent encounter for fracture with routine healing: Secondary | ICD-10-CM

## 2017-12-06 ENCOUNTER — Ambulatory Visit
Admission: RE | Admit: 2017-12-06 | Discharge: 2017-12-06 | Disposition: A | Discharge status: Home / Self Care | Payer: Medicaid Other | Source: Ambulatory Visit | Attending: Orthopedic Surgery | Admitting: Orthopedic Surgery

## 2017-12-06 DIAGNOSIS — S62002D Unspecified fracture of navicular [scaphoid] bone of left wrist, subsequent encounter for fracture with routine healing: Secondary | ICD-10-CM

## 2017-12-06 MED ORDER — IOPAMIDOL (ISOVUE-M 200) INJECTION 41%
20.0000 mL | Freq: Once | INTRAMUSCULAR | Status: AC
  Administered 2017-12-06: 2 mL via INTRA_ARTICULAR

## 2018-06-15 IMAGING — CR DG ELBOW COMPLETE 3+V*L*
4 series · 4 of 4 positions shown · non-contrast
Comparison: None.

CLINICAL DATA: Slip and fall with elbow pain, initial encounter

EXAM:
LEFT ELBOW - COMPLETE 3+ VIEW

[x elbow joint ap left]
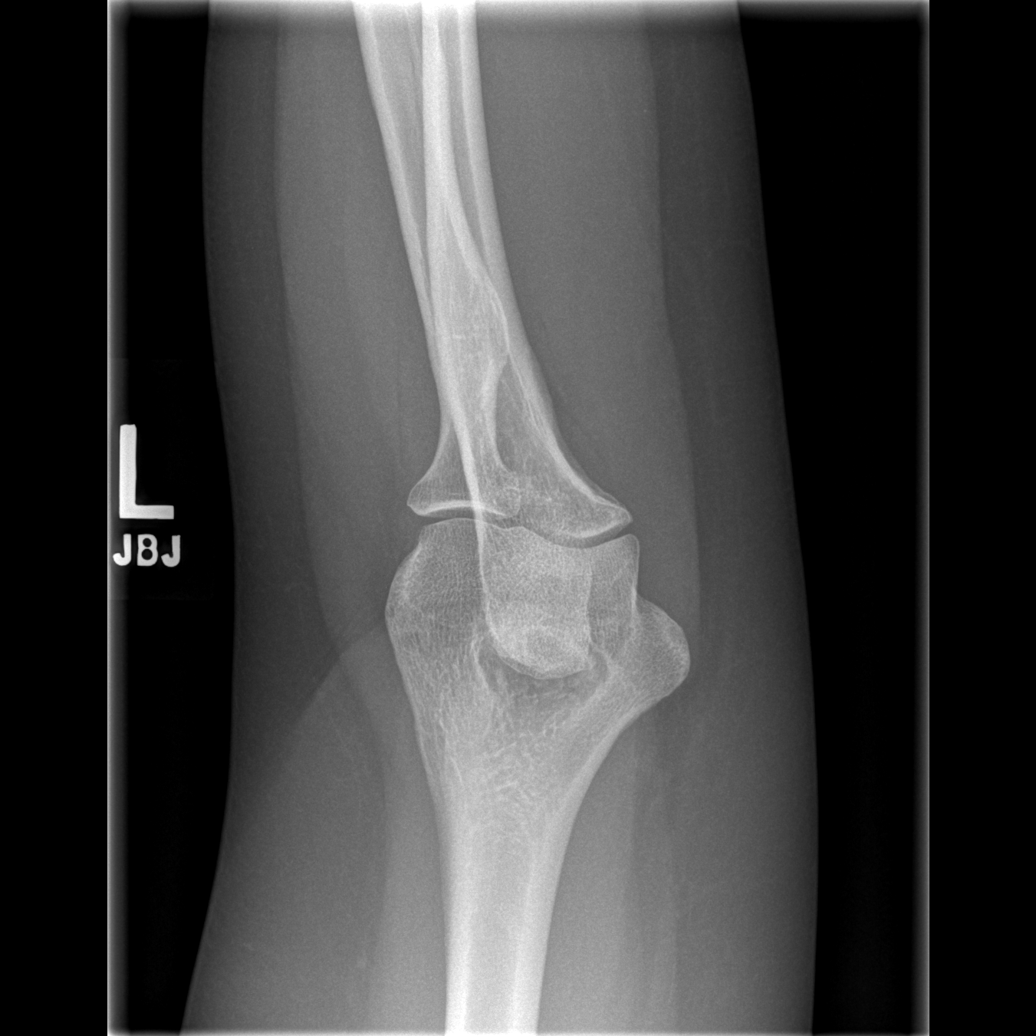

[x elbow joint obl. left (1 of 2)]
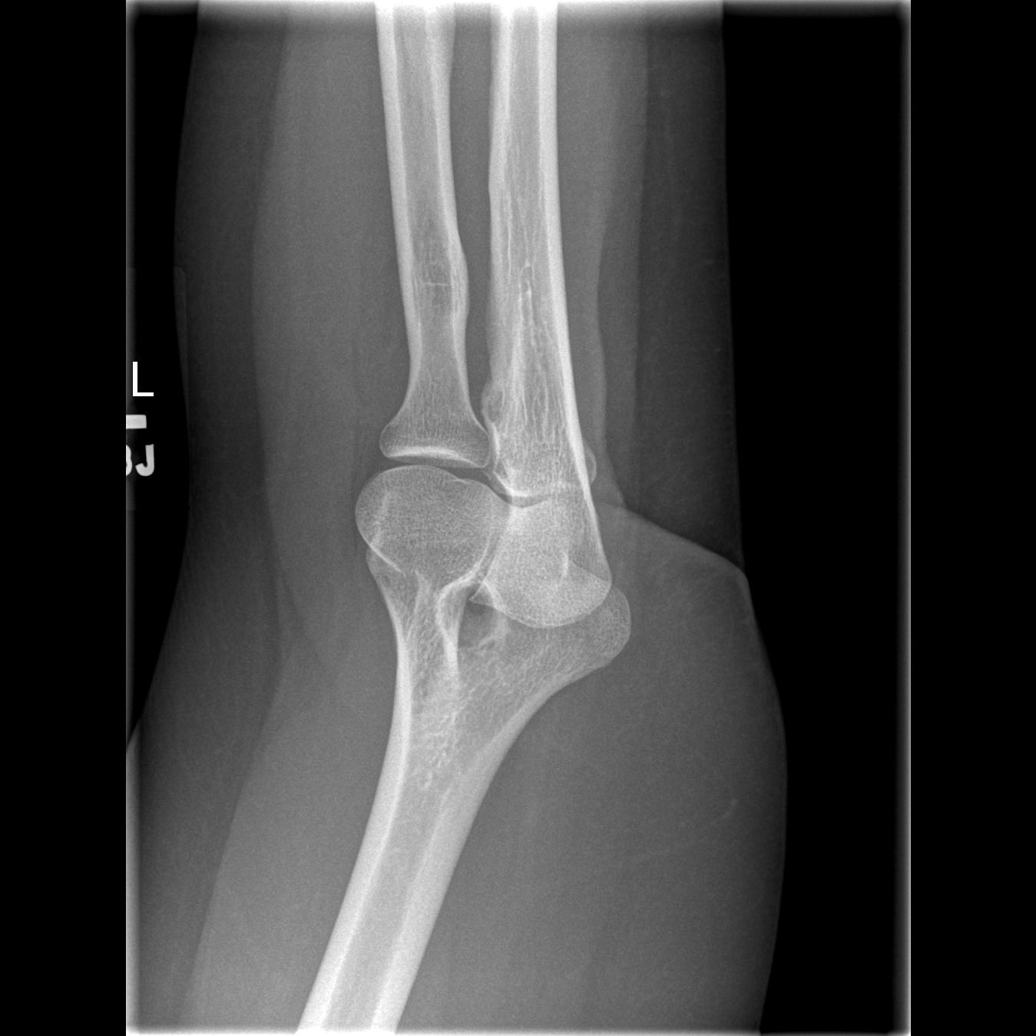

[x elbow joint obl. left (2 of 2)]
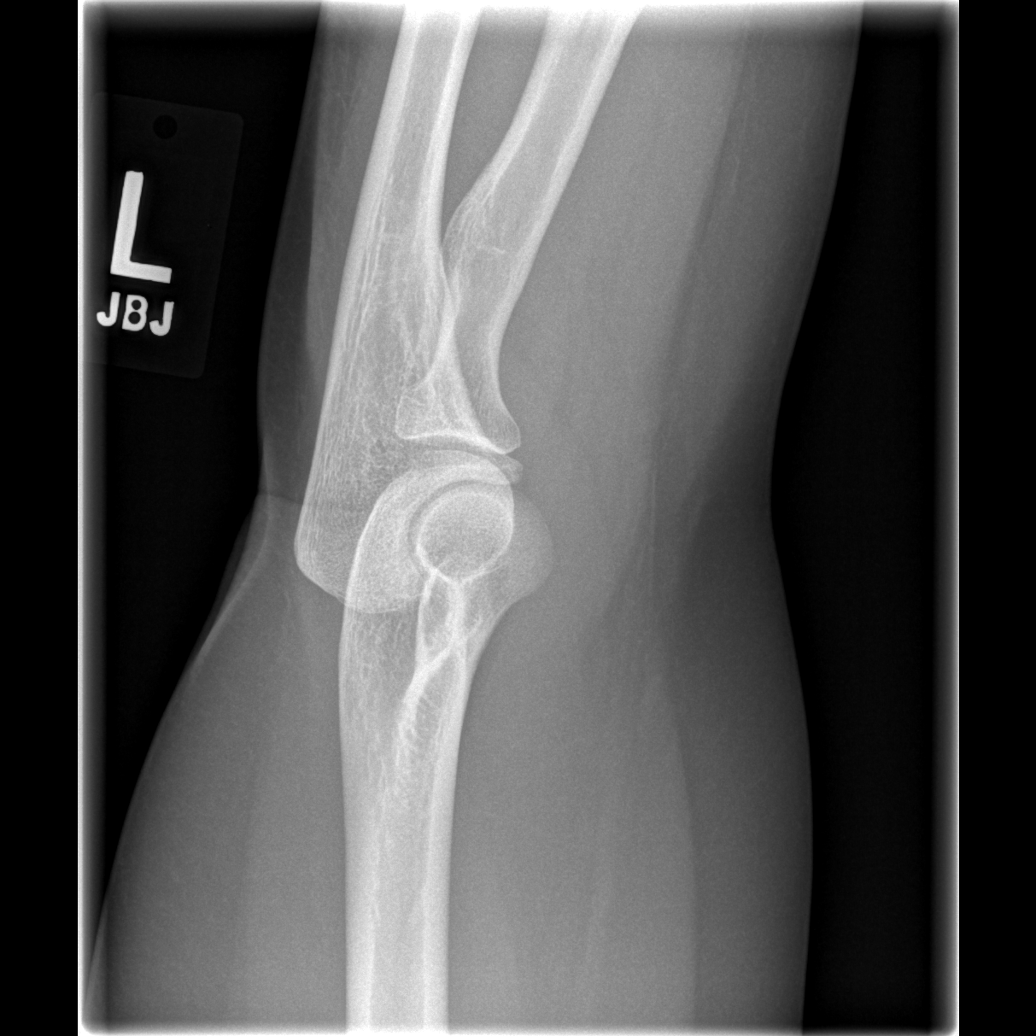

[x elbow joint lat left]
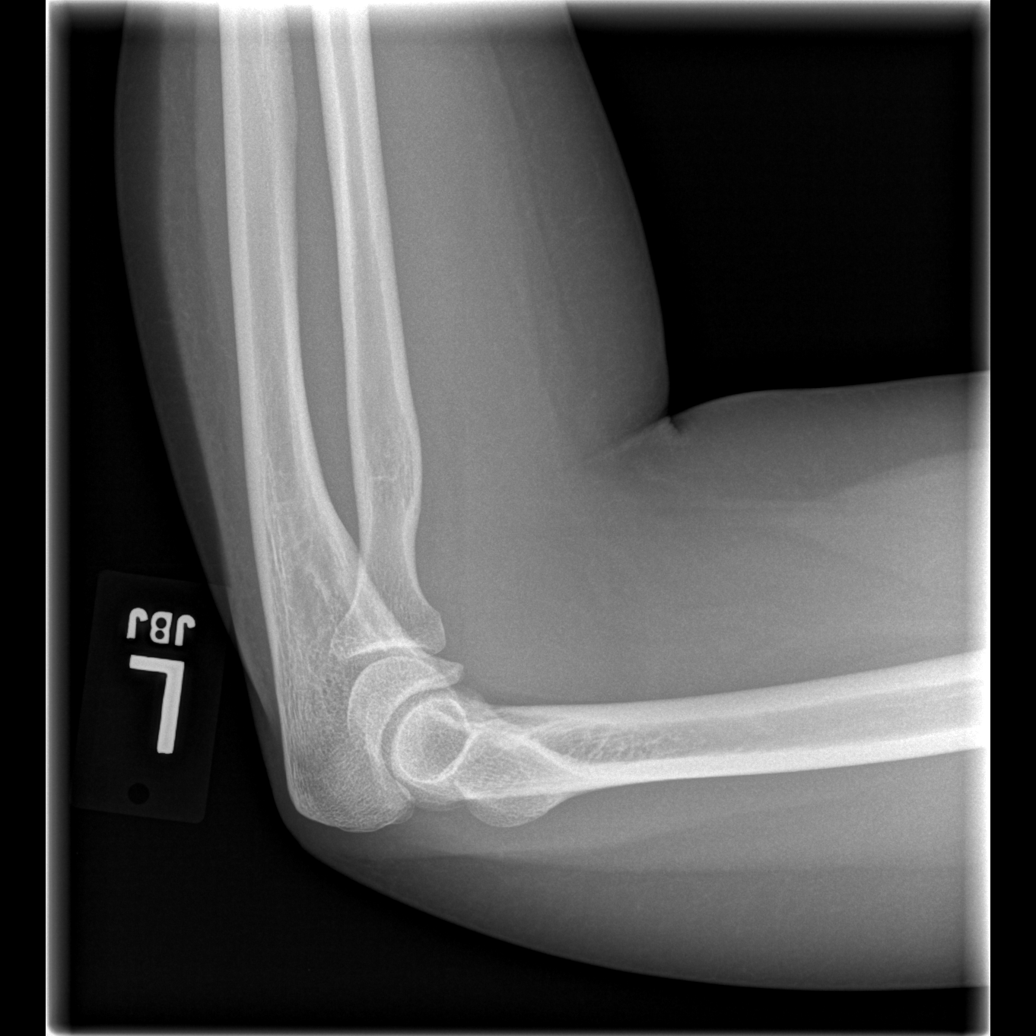

[4 of 4 positions shown; findings below may reference images not displayed]

FINDINGS: There is no evidence of fracture, dislocation, or joint effusion.
There is no evidence of arthropathy or other focal bone abnormality.
Soft tissues are unremarkable.
IMPRESSION: No acute abnormality noted.

## 2018-07-12 IMAGING — XA DG FLUORO GUIDE NDL PLC/BX
1 series · 1 of 1 positions shown · non-contrast
Comparison: none

CLINICAL DATA: Fall 2 months ago. Left wrist pain. Widening of the
scapholunate ligament.

[Series 1: ortho standard · 1 of 1 slices shown]
[im 1/1]
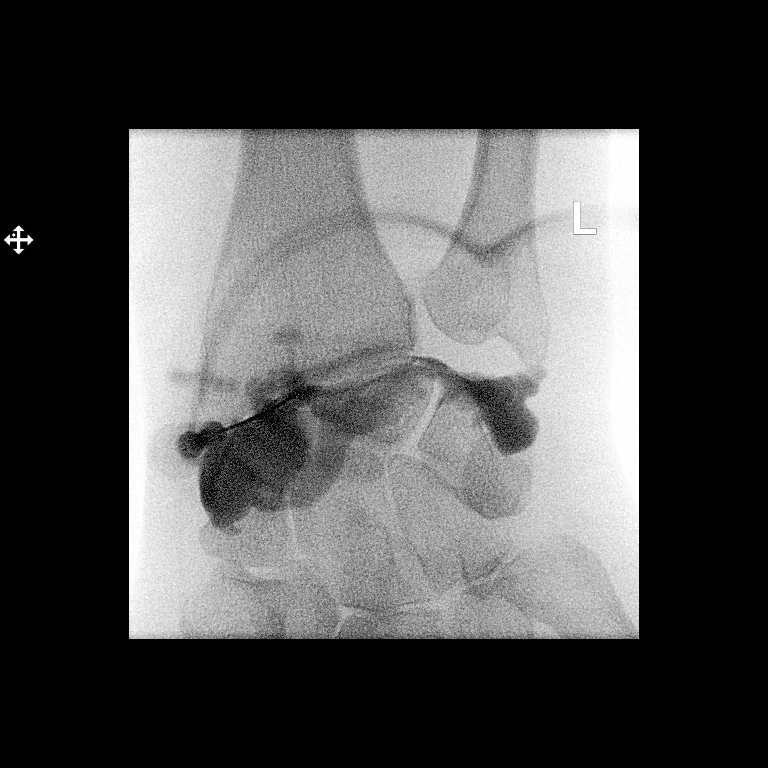

[1 of 1 positions shown; findings below may reference images not displayed]

FLUOROSCOPY TIME:  Radiation Exposure Index (as provided by the
fluoroscopic device): 0.31 uGy*m2

If the device does not provide the exposure index:

Fluoroscopy Time:  17 seconds

Number of Acquired Images:  0

PROCEDURE:
Left WRIST INJECTION UNDER FLUOROSCOPY

An appropriate skin entrance site was determined. The site was
marked, prepped with Betadine, draped in the usual sterile fashion,
and infiltrated locally with 1% Lidocaine. A 25 gauge skin needle
was advanced into the radiocarpal joint under intermittent
fluoroscopy. A mixture of 0.05 ml Multihance and 10 ml of dilute
Isovue-M 200 was then used to opacify the proximal carpal joint. No
immediate complication.
IMPRESSION: Technically successful left wrist injection for MRI.
